# Patient Record
Sex: Male | Born: 2005 | Race: White | Hispanic: No | Marital: Single | Smoking: Never smoker
Health system: Southern US, Community
[De-identification: ages and names within clinical notes are randomized; demographics above are authoritative.]

## PROBLEM LIST (undated history)

## (undated) DIAGNOSIS — F319 Bipolar disorder, unspecified: Secondary | ICD-10-CM

## (undated) DIAGNOSIS — Q613 Polycystic kidney, unspecified: Secondary | ICD-10-CM

## (undated) DIAGNOSIS — I10 Essential (primary) hypertension: Secondary | ICD-10-CM

## (undated) DIAGNOSIS — F909 Attention-deficit hyperactivity disorder, unspecified type: Secondary | ICD-10-CM

## (undated) DIAGNOSIS — J45909 Unspecified asthma, uncomplicated: Secondary | ICD-10-CM

---
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
## 2024-06-12 ENCOUNTER — Ambulatory Visit
Admission: EM | Admit: 2024-06-12 | Discharge: 2024-06-12 | Disposition: A | Discharge status: Home / Self Care | Payer: MEDICAID | Attending: Emergency Medicine | Admitting: Emergency Medicine

## 2024-06-12 ENCOUNTER — Encounter: Payer: Self-pay | Admitting: Emergency Medicine

## 2024-06-12 DIAGNOSIS — B349 Viral infection, unspecified: Secondary | ICD-10-CM

## 2024-06-12 DIAGNOSIS — J029 Acute pharyngitis, unspecified: Secondary | ICD-10-CM

## 2024-06-12 DIAGNOSIS — N5089 Other specified disorders of the male genital organs: Secondary | ICD-10-CM | POA: Diagnosis not present

## 2024-06-12 LAB — POCT RAPID STREP A (OFFICE): Rapid Strep A Screen: NEGATIVE

## 2024-06-12 MED ORDER — DOXYCYCLINE HYCLATE 100 MG PO CAPS: 100.0000 mg | ORAL_CAPSULE | Freq: Two times a day (BID) | ORAL | 0 refills | Status: AC

## 2024-06-12 NOTE — ED Provider Notes (Signed)
 Barry Hart    CSN: [id number] Arrival date & time: 06/12/24  1257      History   Chief Complaint Chief Complaint  Patient presents with   Sore Throat   Cough   Wheezing   Groin Swelling    HPI Barry Hart is a 18 y.o. male.   Patient presents for evaluation of nasal congestion, sore throat, productive cough, shortness of breath at rest exacerbated by exertion and wheezing beginning 1 day ago.  Tolerable to food and liquids.  Known sick contact 3 weeks ago.  Has not attempted treatment of current symptoms.    Patient concerned with swelling to the right testicle first noticed today.  Endorses that it is the size of a golf ball compared to the other side.  Denies pain, fever, heat to the skin, penile drainage or swelling.  Endorses infection to the testicles approximately 1 year ago.    Past Medical History:  Diagnosis Date   ADHD (attention deficit hyperactivity disorder)    Asthma    Bipolar 1 disorder (HCC)    Bipolar 1 disorder (HCC)    Hypertension    Polycystic kidney disease    Polycystic kidney disease     There are no active problems to display for this patient.   History reviewed. No pertinent surgical history.     Home Medications    Prior to Admission medications   Medication Sig Start Date End Date Taking? Authorizing Provider  ARIPiprazole (ABILIFY) 10 MG tablet Take 10 mg by mouth 2 (two) times daily.     [provider]  cloNIDine  (CATAPRES ) 0.1 MG tablet Take 0.1 mg by mouth at bedtime. 11/18/21   [provider]  lamoTRIgine (LAMICTAL) 150 MG tablet Take 150 mg by mouth 2 (two) times daily.     [provider]  lisinopril (PRINIVIL,ZESTRIL) 5 MG tablet Take 5 mg by mouth 2 (two) times daily.     [provider]  Lurasidone HCl (LATUDA) 60 MG TABS Take 1 tablet by mouth daily. 04/15/20   [provider]  Melatonin 5 MG TABS Take 5 mg by mouth at bedtime.     [provider]   montelukast (SINGULAIR) 5 MG chewable tablet Chew 5 mg by mouth daily. 11/20/17   [provider]  MYDAYIS 37.5 MG CP24 Take 37.5 mg by mouth daily. 11/30/17   [provider]  naltrexone (DEPADE) 50 MG tablet Take 50 mg by mouth at bedtime. 11/20/17   [provider]  topiramate (TOPAMAX) 25 MG tablet Take 50 mg by mouth daily. 11/18/21   [provider]  traZODone (DESYREL) 100 MG tablet Take 100 mg by mouth at bedtime.     [provider]  VRAYLAR 3 MG capsule Take 3 mg by mouth daily. 11/18/21   [provider]  VYVANSE 70 MG capsule Take 70 mg by mouth every morning. 11/18/21   [provider]    Family History History reviewed. No pertinent family history.  Social History Social History   Tobacco Use   Smoking status: Never   Smokeless tobacco: Never  Substance Use Topics   Alcohol use: No   Drug use: Never     Allergies   Cat dander, Neomy-bacit-polymyx-pramoxine, Other, and Hydrocodone   Review of Systems Review of Systems  HENT:  Positive for congestion and sore throat. Negative for dental problem, drooling, ear discharge, ear pain, facial swelling, hearing loss, mouth sores, nosebleeds, postnasal drip, rhinorrhea, sinus pressure,  sinus pain, sneezing, tinnitus, trouble swallowing and voice change.   Respiratory:  Positive for cough, shortness of breath and wheezing. Negative for apnea, choking, chest tightness and stridor.   Cardiovascular: Negative.   Gastrointestinal: Negative.      Physical Exam Triage Vital Signs ED Triage Vitals  Encounter Vitals Group     BP 06/12/24 1308 133/84     Girls Systolic BP Percentile --      Girls Diastolic BP Percentile --      Boys Systolic BP Percentile --      Boys Diastolic BP Percentile --      Pulse Rate 06/12/24 1308 (!) 101     Resp 06/12/24 1308 20     Temp 06/12/24 1308 (!) 97.5 F (36.4 C)     Temp Source 06/12/24 1308 Oral     SpO2 06/12/24 1308 96 %      Weight --      Height --      Head Circumference --      Peak Flow --      Pain Score 06/12/24 1312 7     Pain Loc --      Pain Education --      Exclude from Growth Chart --    No data found.  Updated Vital Signs BP 133/84 (BP Location: Right Arm)   Pulse (!) 101   Temp (!) 97.5 F (36.4 C) (Oral)   Resp 20   SpO2 96%   Visual Acuity Right Eye Distance:   Left Eye Distance:   Bilateral Distance:    Right Eye Near:   Left Eye Near:    Bilateral Near:     Physical Exam Constitutional:      Appearance: Normal appearance.  HENT:     Right Ear: Tympanic membrane, ear canal and external ear normal.     Left Ear: Tympanic membrane, ear canal and external ear normal.     Nose: Congestion present.     Mouth/Throat:     Pharynx: Posterior oropharyngeal erythema present. No oropharyngeal exudate.  Cardiovascular:     Rate and Rhythm: Normal rate and regular rhythm.     Pulses: Normal pulses.     Heart sounds: Normal heart sounds.  Pulmonary:     Effort: Pulmonary effort is normal.     Breath sounds: Normal breath sounds.  Genitourinary:    Comments: Erythema present to the right testicle and notable swelling, cool to touch, no signs of torsion, no tenderness on exam Neurological:     Mental Status: He is alert and oriented to person, place, and time.      UC Treatments / Results  Labs (all labs ordered are listed, but only abnormal results are displayed) Labs Reviewed  POCT RAPID STREP A (OFFICE)    EKG   Radiology No results found.  Procedures Procedures (including critical care time)  Medications Ordered in UC Medications - No data to display  Initial Impression / Assessment and Plan / UC Course  I have reviewed the triage vital signs and the nursing notes.  Pertinent labs & imaging results that were available during my care of the patient were reviewed by me and considered in my medical decision making (see chart for details).   Viral illness,  sore throat, swelling of the right testicle  Vitals are stable, child in no send distress nontoxic given, rapid strep test negative, discussed findings, respiratory symptoms most consistent with asthma exacerbation and possibly triggered by viral illness, declined  oral course of prednisone as he endorses he has not attempted his inhaler, endorses an additional inhaler available at home, declined prescription cough medicine, recommend over-the-counter medications and nonpharmacological supportive care with follow-up if symptoms persist worsen or recur  Presentation of the testicle concerning for infection, prior history of such, prescribed doxycycline  advised to monitor closely and to return to urgent care or follow-up with the pediatrician if symptoms continue to persist Final Clinical Impressions(s) / UC Diagnoses   Final diagnoses:  Sore throat   Discharge Instructions   None    ED Prescriptions   None    PDMP not reviewed this encounter.   Teresa Shelba SAUNDERS, NP 06/12/24 1439

## 2024-06-12 NOTE — Discharge Instructions (Signed)
 Your symptoms today are most likely being caused by a virus and should steadily improve in time it can take up to 7 to 10 days before you truly start to see a turnaround however things will get better  strep test is negative for bacteria therefore you may use your albuterol inhaler for management of shortness of breath and wheezing, if no improvement in symptoms please return to clinic for reevaluation  On exam there is redness and swelling to your right testicle concerning for potential infection that you have had in the past, you have been started on antibiotic for management of this and if you feel like symptoms do not improve or worsen please follow-up with your pediatrician for reevaluation, take doxycycline  twice daily for 7 days    You can take Tylenol  and/or Ibuprofen as needed for fever reduction and pain relief.   For cough: honey 1/2 to 1 teaspoon (you can dilute the honey in water or another fluid).  You can also use guaifenesin and dextromethorphan for cough. You can use a humidifier for chest congestion and cough.  If you don't have a humidifier, you can sit in the bathroom with the hot shower running.      For sore throat: try warm salt water gargles, cepacol lozenges, throat spray, warm tea or water with lemon/honey, popsicles or ice, or OTC cold relief medicine for throat discomfort.   For congestion: take a daily anti-histamine like Zyrtec, Claritin, and a oral decongestant, such as pseudoephedrine.  You can also use Flonase 1-2 sprays in each nostril daily.   It is important to stay hydrated: drink plenty of fluids (water, gatorade/powerade/pedialyte, juices, or teas) to keep your throat moisturized and help further relieve irritation/discomfort.

## 2024-06-12 NOTE — ED Triage Notes (Signed)
 Patient reports sore throat, wheezing, cough and right sided testicle swelling x 1 day. Patient has taken anything for symptoms. Rates pain 7/10. Denies testicle pain.
